# Patient Record
Sex: Male | Born: 1955 | Race: White | Hispanic: No | Marital: Married | Smoking: Former smoker
Health system: Southern US, Community
[De-identification: ages and names within clinical notes are randomized; demographics above are authoritative.]

## PROBLEM LIST (undated history)

## (undated) DIAGNOSIS — E785 Hyperlipidemia, unspecified: Secondary | ICD-10-CM

## (undated) DIAGNOSIS — F411 Generalized anxiety disorder: Secondary | ICD-10-CM

## (undated) DIAGNOSIS — M1990 Unspecified osteoarthritis, unspecified site: Secondary | ICD-10-CM

## (undated) HISTORY — DX: Generalized anxiety disorder: F41.1

## (undated) HISTORY — PX: INGUINAL HERNIA REPAIR: SUR1180

## (undated) HISTORY — DX: Hyperlipidemia, unspecified: E78.5

## (undated) HISTORY — PX: ROTATOR CUFF REPAIR: SHX139

## (undated) HISTORY — PX: OTHER SURGICAL HISTORY: SHX169

---

## 2004-12-13 ENCOUNTER — Ambulatory Visit (HOSPITAL_COMMUNITY): Admission: RE | Admit: 2004-12-13 | Discharge: 2004-12-13 | Payer: Self-pay | Admitting: *Deleted

## 2015-08-18 ENCOUNTER — Other Ambulatory Visit: Payer: Self-pay | Admitting: Internal Medicine

## 2015-08-18 DIAGNOSIS — K449 Diaphragmatic hernia without obstruction or gangrene: Secondary | ICD-10-CM

## 2015-08-22 ENCOUNTER — Ambulatory Visit
Admission: RE | Admit: 2015-08-22 | Discharge: 2015-08-22 | Disposition: A | Discharge status: Home / Self Care | Payer: BLUE CROSS/BLUE SHIELD | Source: Ambulatory Visit | Attending: Internal Medicine | Admitting: Internal Medicine

## 2015-08-22 DIAGNOSIS — K449 Diaphragmatic hernia without obstruction or gangrene: Secondary | ICD-10-CM

## 2016-01-11 DIAGNOSIS — Z79899 Other long term (current) drug therapy: Secondary | ICD-10-CM | POA: Diagnosis not present

## 2016-01-11 DIAGNOSIS — M0579 Rheumatoid arthritis with rheumatoid factor of multiple sites without organ or systems involvement: Secondary | ICD-10-CM | POA: Diagnosis not present

## 2016-01-11 DIAGNOSIS — M79641 Pain in right hand: Secondary | ICD-10-CM | POA: Diagnosis not present

## 2016-04-13 DIAGNOSIS — H16041 Marginal corneal ulcer, right eye: Secondary | ICD-10-CM | POA: Diagnosis not present

## 2016-04-17 DIAGNOSIS — H16041 Marginal corneal ulcer, right eye: Secondary | ICD-10-CM | POA: Diagnosis not present

## 2016-04-17 DIAGNOSIS — H01009 Unspecified blepharitis unspecified eye, unspecified eyelid: Secondary | ICD-10-CM | POA: Diagnosis not present

## 2016-04-23 DIAGNOSIS — H01009 Unspecified blepharitis unspecified eye, unspecified eyelid: Secondary | ICD-10-CM | POA: Diagnosis not present

## 2016-04-23 DIAGNOSIS — H16041 Marginal corneal ulcer, right eye: Secondary | ICD-10-CM | POA: Diagnosis not present

## 2016-07-10 DIAGNOSIS — N528 Other male erectile dysfunction: Secondary | ICD-10-CM | POA: Diagnosis not present

## 2016-07-10 DIAGNOSIS — N218 Other lower urinary tract calculus: Secondary | ICD-10-CM | POA: Diagnosis not present

## 2016-07-10 DIAGNOSIS — E349 Endocrine disorder, unspecified: Secondary | ICD-10-CM | POA: Diagnosis not present

## 2016-07-10 DIAGNOSIS — F5232 Male orgasmic disorder: Secondary | ICD-10-CM | POA: Diagnosis not present

## 2016-07-12 DIAGNOSIS — M0579 Rheumatoid arthritis with rheumatoid factor of multiple sites without organ or systems involvement: Secondary | ICD-10-CM | POA: Diagnosis not present

## 2016-07-12 DIAGNOSIS — Z79899 Other long term (current) drug therapy: Secondary | ICD-10-CM | POA: Diagnosis not present

## 2016-07-31 DIAGNOSIS — E538 Deficiency of other specified B group vitamins: Secondary | ICD-10-CM | POA: Diagnosis not present

## 2016-08-06 DIAGNOSIS — Z0000 Encounter for general adult medical examination without abnormal findings: Secondary | ICD-10-CM | POA: Diagnosis not present

## 2016-08-06 DIAGNOSIS — Z125 Encounter for screening for malignant neoplasm of prostate: Secondary | ICD-10-CM | POA: Diagnosis not present

## 2016-08-08 DIAGNOSIS — L723 Sebaceous cyst: Secondary | ICD-10-CM | POA: Diagnosis not present

## 2016-08-08 DIAGNOSIS — L570 Actinic keratosis: Secondary | ICD-10-CM | POA: Diagnosis not present

## 2016-08-08 DIAGNOSIS — X32XXXD Exposure to sunlight, subsequent encounter: Secondary | ICD-10-CM | POA: Diagnosis not present

## 2016-08-10 DIAGNOSIS — Z880 Allergy status to penicillin: Secondary | ICD-10-CM | POA: Diagnosis not present

## 2016-08-10 DIAGNOSIS — Z0001 Encounter for general adult medical examination with abnormal findings: Secondary | ICD-10-CM | POA: Diagnosis not present

## 2016-08-10 DIAGNOSIS — Z23 Encounter for immunization: Secondary | ICD-10-CM | POA: Diagnosis not present

## 2016-08-10 DIAGNOSIS — E291 Testicular hypofunction: Secondary | ICD-10-CM | POA: Diagnosis not present

## 2016-09-11 DIAGNOSIS — D3131 Benign neoplasm of right choroid: Secondary | ICD-10-CM | POA: Diagnosis not present

## 2016-10-23 DIAGNOSIS — E349 Endocrine disorder, unspecified: Secondary | ICD-10-CM | POA: Diagnosis not present

## 2016-10-23 DIAGNOSIS — N281 Cyst of kidney, acquired: Secondary | ICD-10-CM | POA: Diagnosis not present

## 2016-10-23 DIAGNOSIS — N218 Other lower urinary tract calculus: Secondary | ICD-10-CM | POA: Diagnosis not present

## 2016-10-23 DIAGNOSIS — N528 Other male erectile dysfunction: Secondary | ICD-10-CM | POA: Diagnosis not present

## 2016-10-23 DIAGNOSIS — E538 Deficiency of other specified B group vitamins: Secondary | ICD-10-CM | POA: Diagnosis not present

## 2016-12-14 DIAGNOSIS — R3129 Other microscopic hematuria: Secondary | ICD-10-CM | POA: Diagnosis not present

## 2016-12-17 DIAGNOSIS — M25511 Pain in right shoulder: Secondary | ICD-10-CM | POA: Diagnosis not present

## 2016-12-17 DIAGNOSIS — M25562 Pain in left knee: Secondary | ICD-10-CM | POA: Diagnosis not present

## 2016-12-21 DIAGNOSIS — R3129 Other microscopic hematuria: Secondary | ICD-10-CM | POA: Diagnosis not present

## 2016-12-21 DIAGNOSIS — Q548 Other hypospadias: Secondary | ICD-10-CM | POA: Diagnosis not present

## 2016-12-21 DIAGNOSIS — N281 Cyst of kidney, acquired: Secondary | ICD-10-CM | POA: Diagnosis not present

## 2016-12-21 DIAGNOSIS — R828 Abnormal findings on cytological and histological examination of urine: Secondary | ICD-10-CM | POA: Diagnosis not present

## 2016-12-21 DIAGNOSIS — N529 Male erectile dysfunction, unspecified: Secondary | ICD-10-CM | POA: Diagnosis not present

## 2017-01-10 DIAGNOSIS — Z79899 Other long term (current) drug therapy: Secondary | ICD-10-CM | POA: Diagnosis not present

## 2017-01-10 DIAGNOSIS — M79641 Pain in right hand: Secondary | ICD-10-CM | POA: Diagnosis not present

## 2017-01-10 DIAGNOSIS — M0579 Rheumatoid arthritis with rheumatoid factor of multiple sites without organ or systems involvement: Secondary | ICD-10-CM | POA: Diagnosis not present

## 2017-01-23 DIAGNOSIS — M25511 Pain in right shoulder: Secondary | ICD-10-CM | POA: Diagnosis not present

## 2017-01-25 DIAGNOSIS — S46011A Strain of muscle(s) and tendon(s) of the rotator cuff of right shoulder, initial encounter: Secondary | ICD-10-CM | POA: Diagnosis not present

## 2017-01-25 DIAGNOSIS — M25511 Pain in right shoulder: Secondary | ICD-10-CM | POA: Diagnosis not present

## 2017-02-14 DIAGNOSIS — M7541 Impingement syndrome of right shoulder: Secondary | ICD-10-CM | POA: Diagnosis not present

## 2017-02-14 DIAGNOSIS — G8918 Other acute postprocedural pain: Secondary | ICD-10-CM | POA: Diagnosis not present

## 2017-02-14 DIAGNOSIS — M75121 Complete rotator cuff tear or rupture of right shoulder, not specified as traumatic: Secondary | ICD-10-CM | POA: Diagnosis not present

## 2017-04-12 DIAGNOSIS — M25511 Pain in right shoulder: Secondary | ICD-10-CM | POA: Diagnosis not present

## 2017-04-18 DIAGNOSIS — M25511 Pain in right shoulder: Secondary | ICD-10-CM | POA: Diagnosis not present

## 2017-04-25 DIAGNOSIS — M25511 Pain in right shoulder: Secondary | ICD-10-CM | POA: Diagnosis not present

## 2017-05-02 DIAGNOSIS — M25511 Pain in right shoulder: Secondary | ICD-10-CM | POA: Diagnosis not present

## 2017-05-09 DIAGNOSIS — M25511 Pain in right shoulder: Secondary | ICD-10-CM | POA: Diagnosis not present

## 2017-05-22 DIAGNOSIS — L570 Actinic keratosis: Secondary | ICD-10-CM | POA: Diagnosis not present

## 2017-05-22 DIAGNOSIS — X32XXXD Exposure to sunlight, subsequent encounter: Secondary | ICD-10-CM | POA: Diagnosis not present

## 2017-05-30 DIAGNOSIS — B029 Zoster without complications: Secondary | ICD-10-CM | POA: Diagnosis not present

## 2017-06-03 DIAGNOSIS — Z4789 Encounter for other orthopedic aftercare: Secondary | ICD-10-CM | POA: Diagnosis not present

## 2017-06-03 DIAGNOSIS — S46011D Strain of muscle(s) and tendon(s) of the rotator cuff of right shoulder, subsequent encounter: Secondary | ICD-10-CM | POA: Diagnosis not present

## 2017-07-01 DIAGNOSIS — M0579 Rheumatoid arthritis with rheumatoid factor of multiple sites without organ or systems involvement: Secondary | ICD-10-CM | POA: Diagnosis not present

## 2017-07-18 DIAGNOSIS — L729 Follicular cyst of the skin and subcutaneous tissue, unspecified: Secondary | ICD-10-CM | POA: Diagnosis not present

## 2017-07-19 DIAGNOSIS — H1850 Unspecified hereditary corneal dystrophies: Secondary | ICD-10-CM | POA: Diagnosis not present

## 2017-07-19 DIAGNOSIS — H16141 Punctate keratitis, right eye: Secondary | ICD-10-CM | POA: Diagnosis not present

## 2017-08-08 DIAGNOSIS — Z0000 Encounter for general adult medical examination without abnormal findings: Secondary | ICD-10-CM | POA: Diagnosis not present

## 2017-08-08 DIAGNOSIS — E7800 Pure hypercholesterolemia, unspecified: Secondary | ICD-10-CM | POA: Diagnosis not present

## 2017-08-08 DIAGNOSIS — Z125 Encounter for screening for malignant neoplasm of prostate: Secondary | ICD-10-CM | POA: Diagnosis not present

## 2017-08-13 DIAGNOSIS — Z0001 Encounter for general adult medical examination with abnormal findings: Secondary | ICD-10-CM | POA: Diagnosis not present

## 2017-08-13 DIAGNOSIS — E7800 Pure hypercholesterolemia, unspecified: Secondary | ICD-10-CM | POA: Diagnosis not present

## 2017-08-13 DIAGNOSIS — Z1159 Encounter for screening for other viral diseases: Secondary | ICD-10-CM | POA: Diagnosis not present

## 2017-08-13 DIAGNOSIS — Z6827 Body mass index (BMI) 27.0-27.9, adult: Secondary | ICD-10-CM | POA: Diagnosis not present

## 2017-10-16 DIAGNOSIS — H04123 Dry eye syndrome of bilateral lacrimal glands: Secondary | ICD-10-CM | POA: Diagnosis not present

## 2017-10-16 DIAGNOSIS — H11002 Unspecified pterygium of left eye: Secondary | ICD-10-CM | POA: Diagnosis not present

## 2017-10-16 DIAGNOSIS — D3131 Benign neoplasm of right choroid: Secondary | ICD-10-CM | POA: Diagnosis not present

## 2017-12-24 DIAGNOSIS — N529 Male erectile dysfunction, unspecified: Secondary | ICD-10-CM | POA: Diagnosis not present

## 2017-12-24 DIAGNOSIS — Q540 Hypospadias, balanic: Secondary | ICD-10-CM | POA: Diagnosis not present

## 2017-12-24 DIAGNOSIS — N281 Cyst of kidney, acquired: Secondary | ICD-10-CM | POA: Diagnosis not present

## 2017-12-24 DIAGNOSIS — Z87448 Personal history of other diseases of urinary system: Secondary | ICD-10-CM | POA: Diagnosis not present

## 2017-12-30 DIAGNOSIS — Z79899 Other long term (current) drug therapy: Secondary | ICD-10-CM | POA: Diagnosis not present

## 2017-12-30 DIAGNOSIS — M0579 Rheumatoid arthritis with rheumatoid factor of multiple sites without organ or systems involvement: Secondary | ICD-10-CM | POA: Diagnosis not present

## 2017-12-30 DIAGNOSIS — M2550 Pain in unspecified joint: Secondary | ICD-10-CM | POA: Diagnosis not present

## 2018-04-08 DIAGNOSIS — Z23 Encounter for immunization: Secondary | ICD-10-CM | POA: Diagnosis not present

## 2018-08-11 DIAGNOSIS — X32XXXD Exposure to sunlight, subsequent encounter: Secondary | ICD-10-CM | POA: Diagnosis not present

## 2018-08-11 DIAGNOSIS — L570 Actinic keratosis: Secondary | ICD-10-CM | POA: Diagnosis not present

## 2018-08-13 DIAGNOSIS — M2550 Pain in unspecified joint: Secondary | ICD-10-CM | POA: Diagnosis not present

## 2018-08-13 DIAGNOSIS — Z79899 Other long term (current) drug therapy: Secondary | ICD-10-CM | POA: Diagnosis not present

## 2018-08-13 DIAGNOSIS — M0579 Rheumatoid arthritis with rheumatoid factor of multiple sites without organ or systems involvement: Secondary | ICD-10-CM | POA: Diagnosis not present

## 2018-08-14 DIAGNOSIS — K219 Gastro-esophageal reflux disease without esophagitis: Secondary | ICD-10-CM | POA: Diagnosis not present

## 2018-08-14 DIAGNOSIS — Z0000 Encounter for general adult medical examination without abnormal findings: Secondary | ICD-10-CM | POA: Diagnosis not present

## 2018-08-18 DIAGNOSIS — E7800 Pure hypercholesterolemia, unspecified: Secondary | ICD-10-CM | POA: Diagnosis not present

## 2018-08-18 DIAGNOSIS — Z0000 Encounter for general adult medical examination without abnormal findings: Secondary | ICD-10-CM | POA: Diagnosis not present

## 2018-08-28 DIAGNOSIS — L729 Follicular cyst of the skin and subcutaneous tissue, unspecified: Secondary | ICD-10-CM | POA: Diagnosis not present

## 2018-09-30 ENCOUNTER — Other Ambulatory Visit: Payer: Self-pay | Admitting: General Surgery

## 2018-09-30 DIAGNOSIS — L723 Sebaceous cyst: Secondary | ICD-10-CM | POA: Diagnosis not present

## 2018-09-30 DIAGNOSIS — L720 Epidermal cyst: Secondary | ICD-10-CM | POA: Diagnosis not present

## 2019-02-11 DIAGNOSIS — Z79899 Other long term (current) drug therapy: Secondary | ICD-10-CM | POA: Diagnosis not present

## 2019-02-11 DIAGNOSIS — M2550 Pain in unspecified joint: Secondary | ICD-10-CM | POA: Diagnosis not present

## 2019-02-11 DIAGNOSIS — M0579 Rheumatoid arthritis with rheumatoid factor of multiple sites without organ or systems involvement: Secondary | ICD-10-CM | POA: Diagnosis not present

## 2019-02-16 DIAGNOSIS — L729 Follicular cyst of the skin and subcutaneous tissue, unspecified: Secondary | ICD-10-CM | POA: Diagnosis not present

## 2019-03-12 ENCOUNTER — Other Ambulatory Visit: Payer: Self-pay | Admitting: General Surgery

## 2019-03-12 DIAGNOSIS — L720 Epidermal cyst: Secondary | ICD-10-CM | POA: Diagnosis not present

## 2019-04-21 ENCOUNTER — Other Ambulatory Visit: Payer: Self-pay

## 2019-04-21 ENCOUNTER — Emergency Department (HOSPITAL_COMMUNITY): Payer: BC Managed Care – PPO

## 2019-04-21 ENCOUNTER — Encounter (HOSPITAL_COMMUNITY): Payer: Self-pay

## 2019-04-21 ENCOUNTER — Emergency Department (HOSPITAL_COMMUNITY)
Admission: EM | Admit: 2019-04-21 | Discharge: 2019-04-21 | Disposition: A | Discharge status: Home / Self Care | Payer: BC Managed Care – PPO | Attending: Emergency Medicine | Admitting: Emergency Medicine

## 2019-04-21 DIAGNOSIS — Z79899 Other long term (current) drug therapy: Secondary | ICD-10-CM | POA: Diagnosis not present

## 2019-04-21 DIAGNOSIS — R109 Unspecified abdominal pain: Secondary | ICD-10-CM | POA: Diagnosis not present

## 2019-04-21 DIAGNOSIS — K5792 Diverticulitis of intestine, part unspecified, without perforation or abscess without bleeding: Secondary | ICD-10-CM

## 2019-04-21 DIAGNOSIS — R1031 Right lower quadrant pain: Secondary | ICD-10-CM | POA: Diagnosis not present

## 2019-04-21 HISTORY — DX: Unspecified osteoarthritis, unspecified site: M19.90

## 2019-04-21 LAB — CBC WITH DIFFERENTIAL/PLATELET
Abs Immature Granulocytes: 0.03 10*3/uL (ref 0.00–0.07)
Basophils Absolute: 0 10*3/uL (ref 0.0–0.1)
Basophils Relative: 0 %
Eosinophils Absolute: 0.3 10*3/uL (ref 0.0–0.5)
Eosinophils Relative: 2 %
HCT: 49.4 % (ref 39.0–52.0)
Hemoglobin: 16.5 g/dL (ref 13.0–17.0)
Immature Granulocytes: 0 %
Lymphocytes Relative: 14 %
Lymphs Abs: 1.6 10*3/uL (ref 0.7–4.0)
MCH: 30.8 pg (ref 26.0–34.0)
MCHC: 33.4 g/dL (ref 30.0–36.0)
MCV: 92.3 fL (ref 80.0–100.0)
Monocytes Absolute: 1.1 10*3/uL — ABNORMAL HIGH (ref 0.1–1.0)
Monocytes Relative: 10 %
Neutro Abs: 8.1 10*3/uL — ABNORMAL HIGH (ref 1.7–7.7)
Neutrophils Relative %: 74 %
Platelets: 230 10*3/uL (ref 150–400)
RBC: 5.35 MIL/uL (ref 4.22–5.81)
RDW: 12.3 % (ref 11.5–15.5)
WBC: 11.1 10*3/uL — ABNORMAL HIGH (ref 4.0–10.5)
nRBC: 0 % (ref 0.0–0.2)

## 2019-04-21 LAB — COMPREHENSIVE METABOLIC PANEL
ALT: 26 U/L (ref 0–44)
AST: 25 U/L (ref 15–41)
Albumin: 3.8 g/dL (ref 3.5–5.0)
Alkaline Phosphatase: 65 U/L (ref 38–126)
Anion gap: 10 (ref 5–15)
BUN: 17 mg/dL (ref 8–23)
CO2: 24 mmol/L (ref 22–32)
Calcium: 9.2 mg/dL (ref 8.9–10.3)
Chloride: 105 mmol/L (ref 98–111)
Creatinine, Ser: 1.09 mg/dL (ref 0.61–1.24)
GFR calc Af Amer: >60 mL/min (ref >60)
GFR calc non Af Amer: >60 mL/min (ref >60)
Glucose, Bld: 101 mg/dL — ABNORMAL HIGH (ref 70–99)
Potassium: 4 mmol/L (ref 3.5–5.1)
Sodium: 139 mmol/L (ref 135–145)
Total Bilirubin: 1.6 mg/dL — ABNORMAL HIGH (ref 0.3–1.2)
Total Protein: 7.1 g/dL (ref 6.5–8.1)

## 2019-04-21 LAB — URINALYSIS, ROUTINE W REFLEX MICROSCOPIC
Bacteria, UA: NONE SEEN
Bilirubin Urine: NEGATIVE
Glucose, UA: NEGATIVE mg/dL
Ketones, ur: NEGATIVE mg/dL
Leukocytes,Ua: NEGATIVE
Nitrite: NEGATIVE
Protein, ur: 30 mg/dL — AB
Specific Gravity, Urine: 1.018 (ref 1.005–1.030)
pH: 6 (ref 5.0–8.0)

## 2019-04-21 MED ORDER — METRONIDAZOLE 500 MG PO TABS
500.0000 mg | ORAL_TABLET | Freq: Once | ORAL | Status: AC
  Administered 2019-04-21: 20:00:00 500 mg via ORAL
  Filled 2019-04-21: qty 1

## 2019-04-21 MED ORDER — CIPROFLOXACIN HCL 500 MG PO TABS
500.0000 mg | ORAL_TABLET | Freq: Once | ORAL | Status: AC
  Administered 2019-04-21: 20:00:00 500 mg via ORAL
  Filled 2019-04-21: qty 1

## 2019-04-21 MED ORDER — IOHEXOL 300 MG/ML  SOLN
100.0000 mL | Freq: Once | INTRAMUSCULAR | Status: AC | PRN
  Administered 2019-04-21: 19:00:00 100 mL via INTRAVENOUS

## 2019-04-21 MED ORDER — SODIUM CHLORIDE (PF) 0.9 % IJ SOLN
INTRAMUSCULAR | Status: AC
  Administered 2019-04-21: 19:00:00
  Filled 2019-04-21: qty 50

## 2019-04-21 MED ORDER — CIPROFLOXACIN HCL 500 MG PO TABS: 500.0000 mg | ORAL_TABLET | Freq: Two times a day (BID) | ORAL | 0 refills | Status: DC

## 2019-04-21 MED ORDER — METRONIDAZOLE 500 MG PO TABS: 500.0000 mg | ORAL_TABLET | Freq: Three times a day (TID) | ORAL | 0 refills | Status: AC

## 2019-04-21 NOTE — ED Triage Notes (Signed)
Pt reports lower abd pain all day. Pt sent from PCP to r/o appendicitis.

## 2019-04-21 NOTE — ED Provider Notes (Signed)
[facility] DEPT Provider Note   CSN: [id number] Arrival date & time: 04/21/19  1458     History   Chief Complaint Chief Complaint  Patient presents with  . Abdominal Pain    HPI Tyler Arnold is a 63 y.o. male with past medical history of remote inguinal hernia repair at age 58, presenting to the emergency department with bilateral lower abdominal pain.  Patient states about a week ago he had symptoms in his lower abdomen of pain described as a toothache.  He also felt some constipation though that was improved with a laxative.  He has had normal bowel movements since then all the way through today.  He states his pain increased today and is still in his lower abdomen, was earlier on his right lower quadrant by PCP, it is now currently in his left lower quadrant.  It is worse when he sits up.  It feels mostly like a toothache though with some intermittent sharp pains.  He has no associated nausea, vomiting, diarrhea.  He feels as though he needs to pass gas.  He denies urinary symptoms, fever.  Seen at PCP today and sent here for further evaluation.  No known history of diverticulitis.     The history is provided by the patient.    Past Medical History:  Diagnosis Date  . Arthritis    rheumatoid    There are no active problems to display for this patient.   Past Surgical History:  Procedure Laterality Date  . ROTATOR CUFF REPAIR Right         Home Medications    Prior to Admission medications   Medication Sig Start Date End Date Taking? Authorizing Provider  atorvastatin (LIPITOR) 20 MG tablet Take 20 mg by mouth daily.   Yes [provider]  busPIRone (BUSPAR) 7.5 MG tablet Take 15 mg by mouth 2 (two) times daily.   Yes [provider]  cyanocobalamin (,VITAMIN B-12,) 1000 MCG/ML injection Inject 1,000 mcg into the muscle every 30 (thirty) days.   Yes [provider]  etanercept (ENBREL) 50 MG/ML injection  Inject 50 mg into the skin once a week.   Yes [provider]  famotidine (PEPCID) 20 MG tablet Take 20 mg by mouth at bedtime as needed for heartburn or indigestion.   Yes [provider]  fluticasone (FLONASE) 50 MCG/ACT nasal spray Place 2 sprays into both nostrils daily.   Yes [provider]  multivitamin (ONE-A-DAY MEN'S) TABS tablet Take 1 tablet by mouth daily.   Yes [provider]  naproxen (NAPROSYN) 250 MG tablet Take 250 mg by mouth 2 (two) times daily with a meal.   Yes [provider]  Omega-3 Fatty Acids (FISH OIL) 1000 MG CAPS Take 1,000 mg by mouth 2 (two) times daily.   Yes [provider]  sildenafil (REVATIO) 20 MG tablet Take 40-100 mg by mouth daily as needed (Erectile Dysfunction).   Yes [provider]  Testosterone 25 MG/2.5GM (1%) GEL Place 1 Pump onto the skin See admin instructions. Apply 1 pump under each arm daily.   Yes [provider]  ciprofloxacin (CIPRO) 500 MG tablet Take 1 tablet (500 mg total) by mouth 2 (two) times daily. 04/21/19   Harold Moncus, [place] N, PA-C  metroNIDAZOLE (FLAGYL) 500 MG tablet Take 1 tablet (500 mg total) by mouth 3 (three) times daily for 7 days. 04/21/19 04/28/19  Welles Walthall, [place] N, PA-C    Family History No family  history on file.  Social History Social History   Tobacco Use  . Smoking status: Never Smoker  . Smokeless tobacco: Never Used  Substance Use Topics  . Alcohol use: Yes    Comment: socially  . Drug use: Never     Allergies   Penicillins   Review of Systems Review of Systems  Constitutional: Negative for fever.  Gastrointestinal: Positive for abdominal pain.  All other systems reviewed and are negative.    Physical Exam Updated Vital Signs BP (!) 123/91   Pulse 78   Temp 98.8 F (37.1 C) (Oral)   Resp 16   Wt 84.4 kg   SpO2 100%   Physical Exam Vitals signs and nursing note reviewed.  Constitutional:      General: He is not in  acute distress.    Appearance: He is well-developed. He is not ill-appearing.  HENT:     Head: Normocephalic and atraumatic.  Eyes:     Conjunctiva/sclera: Conjunctivae normal.  Cardiovascular:     Rate and Rhythm: Normal rate and regular rhythm.  Pulmonary:     Effort: Pulmonary effort is normal. No respiratory distress.     Breath sounds: Normal breath sounds.  Abdominal:     General: Bowel sounds are normal.     Palpations: Abdomen is soft.     Tenderness: There is abdominal tenderness in the left lower quadrant. There is no guarding or rebound. Negative signs include Rovsing's sign and McBurney's sign.  Skin:    General: Skin is warm.  Neurological:     Mental Status: He is alert.  Psychiatric:        Behavior: Behavior normal.      ED Treatments / Results  Labs (all labs ordered are listed, but only abnormal results are displayed) Labs Reviewed  COMPREHENSIVE METABOLIC PANEL - Abnormal; Notable for the following components:      Result Value   Glucose, Bld 101 (*)    Total Bilirubin 1.6 (*)    All other components within normal limits  CBC WITH DIFFERENTIAL/PLATELET - Abnormal; Notable for the following components:   WBC 11.1 (*)    Neutro Abs 8.1 (*)    Monocytes Absolute 1.1 (*)    All other components within normal limits  URINALYSIS, ROUTINE W REFLEX MICROSCOPIC - Abnormal; Notable for the following components:   Hgb urine dipstick MODERATE (*)    Protein, ur 30 (*)    All other components within normal limits    EKG None  Radiology Ct Abdomen Pelvis W Contrast  Result Date: 04/21/2019 CLINICAL DATA:  Acute abdominal pain. Progressive right lower quadrant pain. EXAM: CT ABDOMEN AND PELVIS WITH CONTRAST TECHNIQUE: Multidetector CT imaging of the abdomen and pelvis was performed using the standard protocol following bolus administration of intravenous contrast. CONTRAST:  130mL OMNIPAQUE IOHEXOL 300 MG/ML  SOLN COMPARISON:  Report from CT 09/20/2014 at [facility], images not available. FINDINGS: Lower chest: Lobulated serpiginous hyperdensity in the left costophrenic angle is communication with the vasculature and is consistent with vascular malformation. Mild adjacent atelectasis and minimal pleural thickening. Minimal subsegmental atelectasis in the right lower lobe. Heart is normal in size. Hepatobiliary: No focal liver abnormality is seen. No gallstones, gallbladder wall thickening, or biliary dilatation. Pancreas: No ductal dilatation or inflammation. Spleen: Normal in size without focal abnormality. Adrenals/Urinary Tract: No adrenal nodule. No hydronephrosis or perinephric edema. Homogeneous renal enhancement with symmetric excretion on delayed phase imaging. Subcentimeter cortical hypodensities in the lower left kidney  are too small to characterize but likely cysts. Urinary bladder is physiologically distended without wall thickening. Stomach/Bowel: Tiny hiatal hernia. Stomach physiologically distended. No small bowel obstruction or inflammatory change. Cecum slightly high-riding in the right mid abdomen. The appendix is not confidently visualized, there is no pericecal inflammation to suggest appendicitis. Two adjacent inflamed diverticula with associated colonic wall thickening and pericolonic edema. Small amount of free fluid in the left pericolic gutter. No extraluminal air or abscess. Additional noninflamed colonic diverticula throughout the colon, prominent in the sigmoid. At the junction of the descending and sigmoid colon Vascular/Lymphatic: Normal caliber abdominal aorta. Retroaortic left renal vein, normal variant. The portal vein is patent. No enlarged lymph nodes in the abdomen or pelvis. Reproductive: No acute findings. Slightly prominent prostate gland 4.8 cm transverse. Other: Small amount of free fluid in the left pericolic gutter. No free air. No intra-abdominal abscess. Small fat containing umbilical hernia. Musculoskeletal:  Unilateral left L5 pars interarticularis defect. No listhesis. There are no acute or suspicious osseous abnormalities. IMPRESSION: 1. Uncomplicated acute diverticulitis at the junction of the descending and sigmoid colon. No perforation or abscess. 2. 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 3. Incidental small pulmonary vascular formation in the included lung bases in the left lower lobe. Electronically Signed   By: Keith Rake M.D.   On: 04/21/2019 19:13    Procedures Procedures (including critical care time)  Medications Ordered in ED Medications  ciprofloxacin (CIPRO) tablet 500 mg (has no administration in time range)  metroNIDAZOLE (FLAGYL) tablet 500 mg (has no administration in time range)  iohexol (OMNIPAQUE) 300 MG/ML solution 100 mL (100 mLs Intravenous Contrast Given 04/21/19 1834)  sodium chloride (PF) 0.9 % injection (  Given by Other 04/21/19 1905)     Initial Impression / Assessment and Plan / ED Course  I have reviewed the triage vital signs and the nursing notes.  Pertinent labs & imaging results that were available during my care of the patient were reviewed by me and considered in my medical decision making (see chart for details).        Patient presenting with intermittent lower abdominal pain, diagnosis of acute uncomplicated diverticulitis via CT scan today.  He is overall well-appearing, in no distress.  Requiring no pain medication in the ED.  Afebrile.  Mild leukocytosis of 11.1 on CBC though labs are otherwise unremarkable.  Patient will be treated with p.o. Cipro and Flagyl and instructed to follow close with PCP.  Strict return precautions discussed.  Patient agreeable to plan and safe for discharge.  Discussed results, findings, treatment and follow up. Patient advised of return precautions. Patient verbalized understanding and agreed with plan.  Final Clinical Impressions(s) / ED Diagnoses   Final diagnoses:  Diverticulitis     ED Discharge Orders         Ordered    ciprofloxacin (CIPRO) 500 MG tablet  2 times daily     04/21/19 1937    metroNIDAZOLE (FLAGYL) 500 MG tablet  3 times daily     04/21/19 1937           Domique Reardon, [place] N, PA-C 04/21/19 1939    Little, Wenda Overland, MD 04/22/19 [phone]

## 2019-04-21 NOTE — ED Notes (Signed)
Coming from PCP-states RLQ pain for about a week-states increased pain today-will be sending office note and lab results with patient

## 2019-04-21 NOTE — Discharge Instructions (Signed)
It is important you stay hydrated. Take the antibiotics as prescribed until completely gone. Do not drink alcohol with this antibiotic as it can cause vomiting. You can continue treating your symptoms with over-the-counter medications as needed. Follow-up with your primary care provider. Return to the emergency department for fevers, severely worsening pain, or new concerning symptoms.

## 2019-04-21 NOTE — ED Notes (Signed)
Pt was verbalized discharge instructions. Pt had no further questions at this time. NAD. 

## 2019-04-29 DIAGNOSIS — Z23 Encounter for immunization: Secondary | ICD-10-CM | POA: Diagnosis not present

## 2019-06-15 DIAGNOSIS — L570 Actinic keratosis: Secondary | ICD-10-CM | POA: Diagnosis not present

## 2019-06-15 DIAGNOSIS — X32XXXD Exposure to sunlight, subsequent encounter: Secondary | ICD-10-CM | POA: Diagnosis not present

## 2019-06-15 DIAGNOSIS — D225 Melanocytic nevi of trunk: Secondary | ICD-10-CM | POA: Diagnosis not present

## 2019-06-15 DIAGNOSIS — L821 Other seborrheic keratosis: Secondary | ICD-10-CM | POA: Diagnosis not present

## 2019-08-18 DIAGNOSIS — Z79899 Other long term (current) drug therapy: Secondary | ICD-10-CM | POA: Diagnosis not present

## 2019-08-18 DIAGNOSIS — Z111 Encounter for screening for respiratory tuberculosis: Secondary | ICD-10-CM | POA: Diagnosis not present

## 2019-08-18 DIAGNOSIS — M0579 Rheumatoid arthritis with rheumatoid factor of multiple sites without organ or systems involvement: Secondary | ICD-10-CM | POA: Diagnosis not present

## 2019-08-18 DIAGNOSIS — M2550 Pain in unspecified joint: Secondary | ICD-10-CM | POA: Diagnosis not present

## 2019-08-24 DIAGNOSIS — Z125 Encounter for screening for malignant neoplasm of prostate: Secondary | ICD-10-CM | POA: Diagnosis not present

## 2019-08-24 DIAGNOSIS — E7800 Pure hypercholesterolemia, unspecified: Secondary | ICD-10-CM | POA: Diagnosis not present

## 2019-08-26 DIAGNOSIS — Z87891 Personal history of nicotine dependence: Secondary | ICD-10-CM | POA: Diagnosis not present

## 2019-08-26 DIAGNOSIS — Z0001 Encounter for general adult medical examination with abnormal findings: Secondary | ICD-10-CM | POA: Diagnosis not present

## 2019-08-26 DIAGNOSIS — E7800 Pure hypercholesterolemia, unspecified: Secondary | ICD-10-CM | POA: Diagnosis not present

## 2019-09-25 IMAGING — CT CT ABD-PELV W/ CM
2 of 5 series · 15 of 46 positions shown, 17 images · IV contrast (omnipaque)
Comparison: Report from CT 09/20/2014 at [REDACTED],
images not available.

CLINICAL DATA: Acute abdominal pain. Progressive right lower
quadrant pain.

EXAM:
CT ABDOMEN AND PELVIS WITH CONTRAST
TECHNIQUE: Multidetector CT imaging of the abdomen and pelvis was performed
using the standard protocol following bolus administration of
intravenous contrast.
CONTRAST:  100mL OMNIPAQUE IOHEXOL 300 MG/ML  SOLN

[Series 2: axial st · axial · 0.83mm/px · z∈[+689,+1114]mm · 12 of 99 slices shown, 14 images]
[im 7/99  soft-tissue]
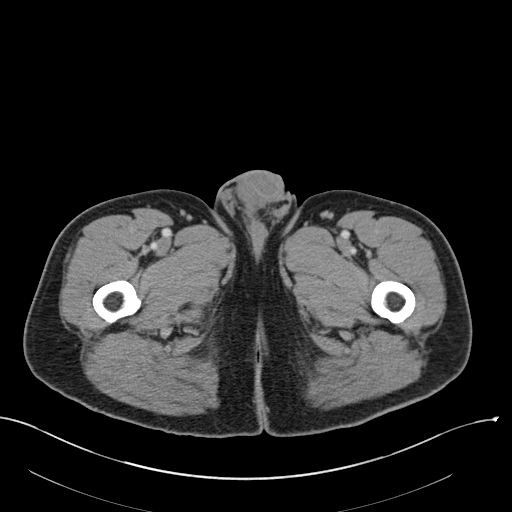
[im 7/99  bone]
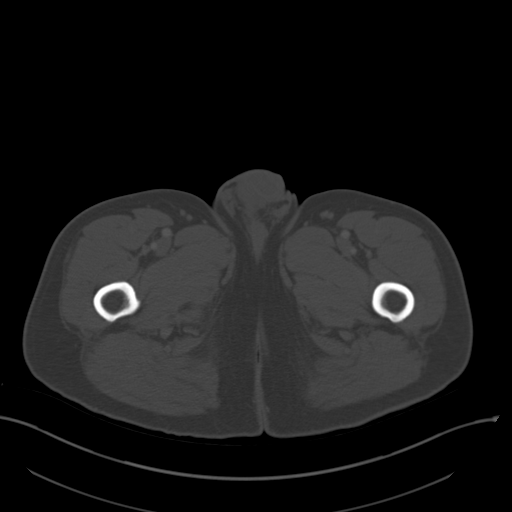
[im 14/99  soft-tissue]
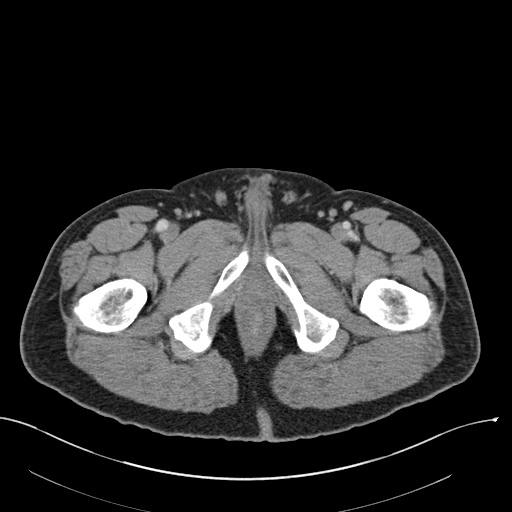
[im 20/99  soft-tissue]
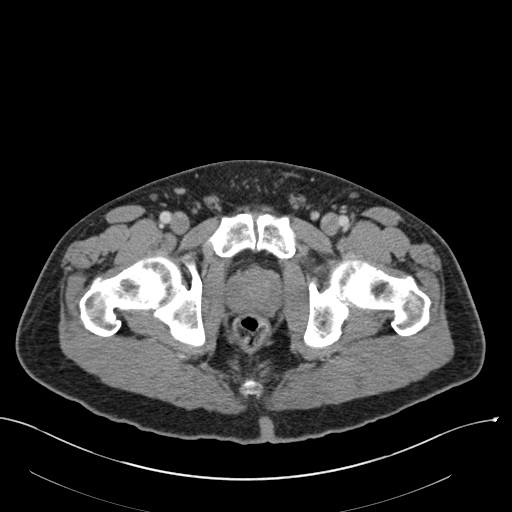
[im 33/99  soft-tissue]
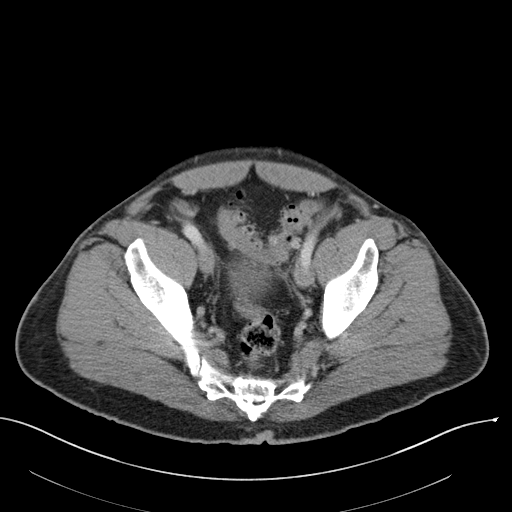
[im 40/99  soft-tissue]
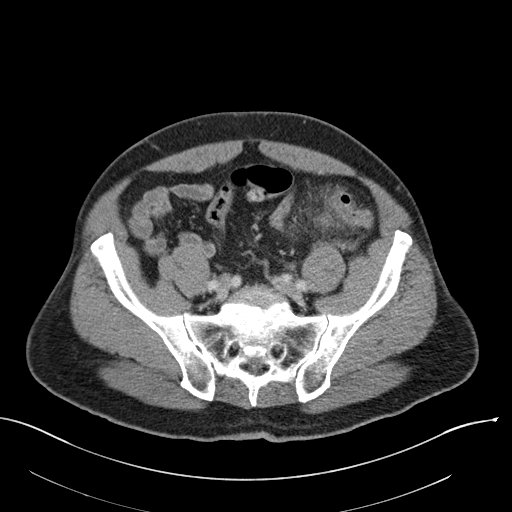
[im 46/99  soft-tissue]
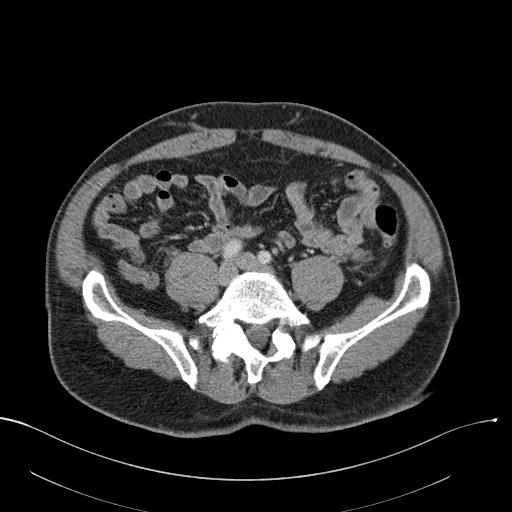
[im 53/99  soft-tissue]
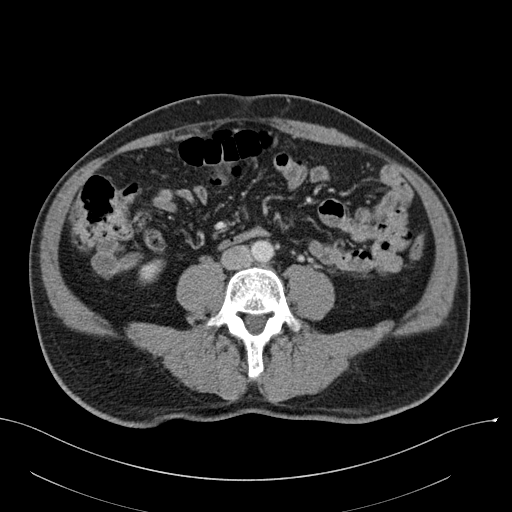
[im 59/99  soft-tissue]
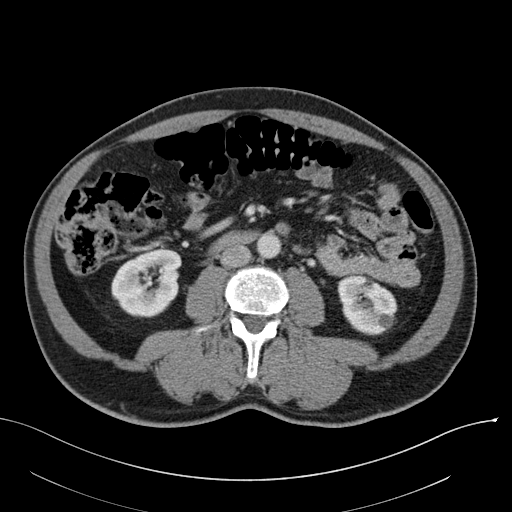
[im 66/99  soft-tissue]
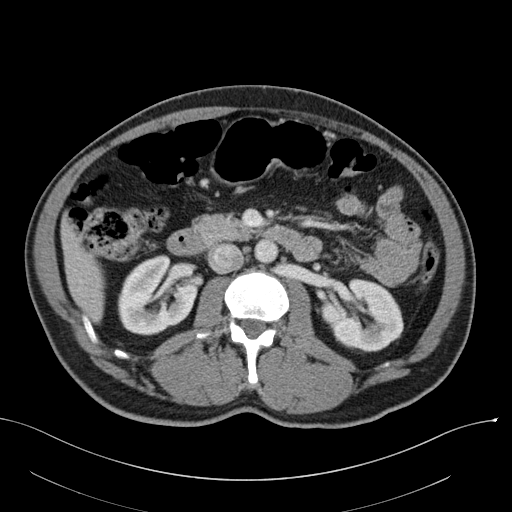
[im 66/99  bone]
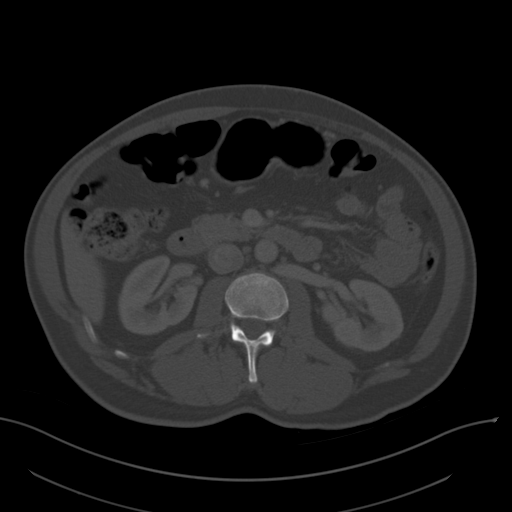
[im 79/99  soft-tissue]
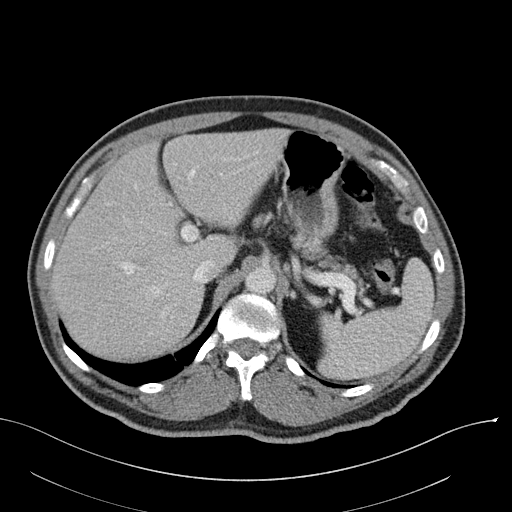
[im 85/99  soft-tissue]
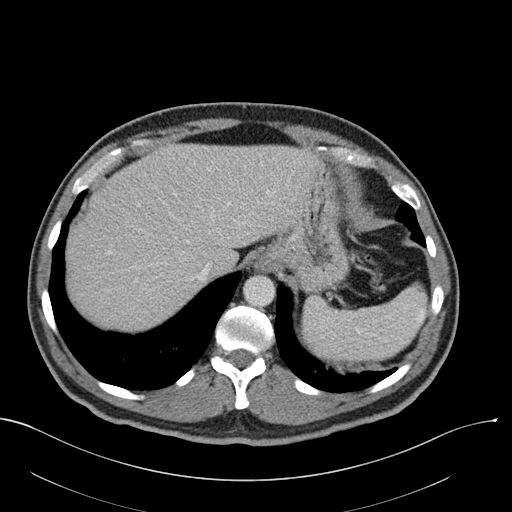
[im 92/99  soft-tissue]
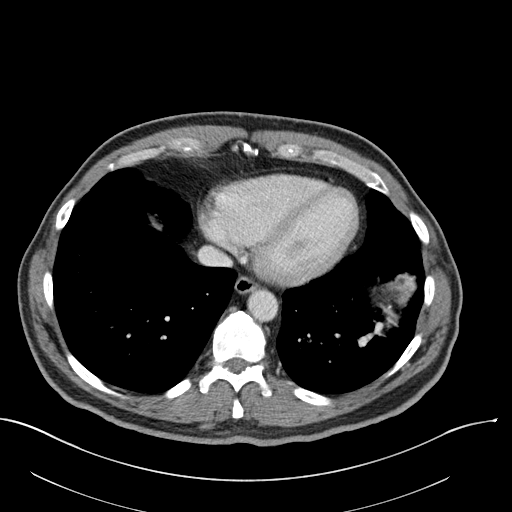

[Series 4: coronal st · coronal · 0.88mm/px · 3 of 138 slices shown]
[im 46/138  soft-tissue]
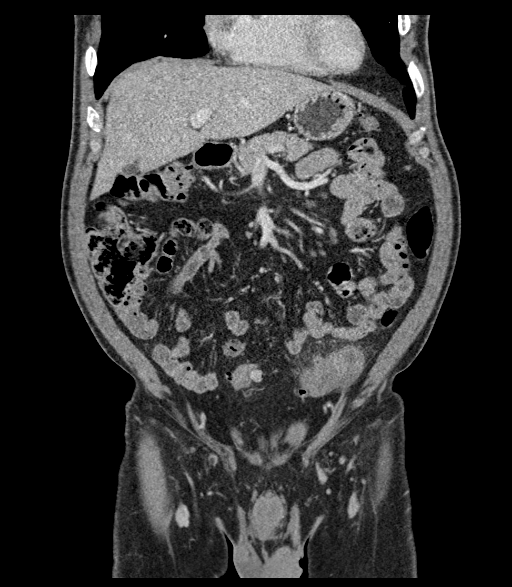
[im 61/138  soft-tissue]
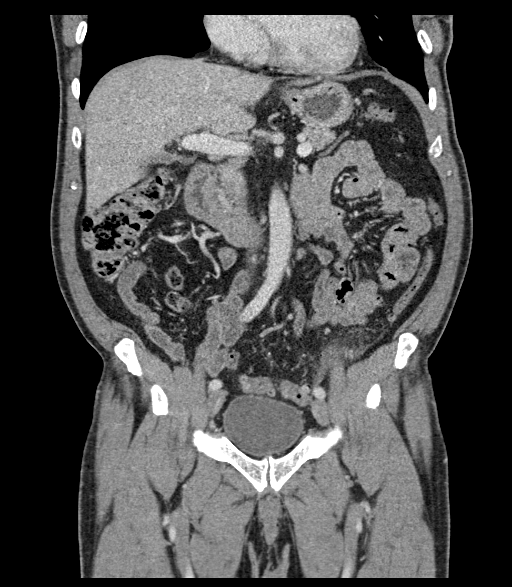
[im 77/138  soft-tissue]
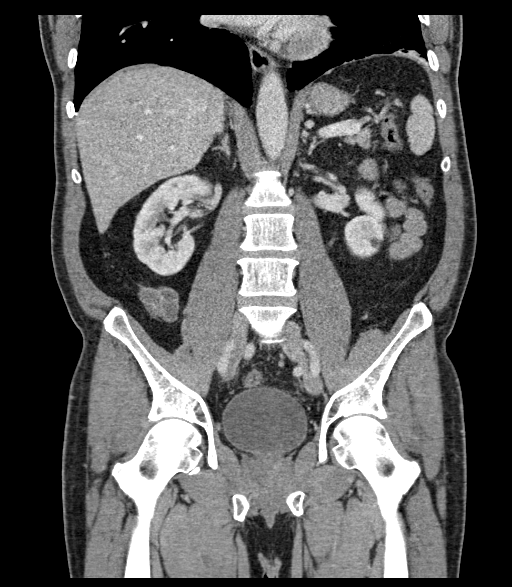

[15 of 46 positions shown; findings below may reference images not displayed]

FINDINGS: Lower chest: Lobulated serpiginous hyperdensity in the left
costophrenic angle is communication with the vasculature and is
consistent with vascular malformation. Mild adjacent atelectasis and
minimal pleural thickening. Minimal subsegmental atelectasis in the
right lower lobe. Heart is normal in size.

Hepatobiliary: No focal liver abnormality is seen. No gallstones,
gallbladder wall thickening, or biliary dilatation.

Pancreas: No ductal dilatation or inflammation.

Spleen: Normal in size without focal abnormality.

Adrenals/Urinary Tract: No adrenal nodule. No hydronephrosis or
perinephric edema. Homogeneous renal enhancement with symmetric
excretion on delayed phase imaging. Subcentimeter cortical
hypodensities in the lower left kidney are too small to characterize
but likely cysts. Urinary bladder is physiologically distended
without wall thickening.

Stomach/Bowel: Tiny hiatal hernia. Stomach physiologically
distended. No small bowel obstruction or inflammatory change. Cecum
slightly high-riding in the right mid abdomen. The appendix is not
confidently visualized, there is no pericecal inflammation to
suggest appendicitis.

Two adjacent inflamed diverticula with associated colonic wall
thickening and pericolonic edema. Small amount of free fluid in the
left pericolic gutter. No extraluminal air or abscess. Additional
noninflamed colonic diverticula throughout the colon, prominent in
the sigmoid. At the junction of the descending and sigmoid colon

Vascular/Lymphatic: Normal caliber abdominal aorta. Retroaortic left
renal vein, normal variant. The portal vein is patent. No enlarged
lymph nodes in the abdomen or pelvis.

Reproductive: No acute findings. Slightly prominent prostate gland
4.8 cm transverse.

Other: Small amount of free fluid in the left pericolic gutter. No
free air. No intra-abdominal abscess. Small fat containing umbilical
hernia.

Musculoskeletal: Unilateral left L5 pars interarticularis defect. No
listhesis. There are no acute or suspicious osseous abnormalities.
IMPRESSION: 1. Uncomplicated acute diverticulitis at the junction of the
descending and sigmoid colon. No perforation or abscess.
2. Unilateral left L5 pars interarticularis defect without
listhesis.
3. Incidental small pulmonary vascular formation in the included
lung bases in the left lower lobe.

## 2019-11-26 DIAGNOSIS — D3131 Benign neoplasm of right choroid: Secondary | ICD-10-CM | POA: Diagnosis not present

## 2020-02-22 DIAGNOSIS — M0579 Rheumatoid arthritis with rheumatoid factor of multiple sites without organ or systems involvement: Secondary | ICD-10-CM | POA: Diagnosis not present

## 2020-02-22 DIAGNOSIS — Z79899 Other long term (current) drug therapy: Secondary | ICD-10-CM | POA: Diagnosis not present

## 2020-02-22 DIAGNOSIS — M2550 Pain in unspecified joint: Secondary | ICD-10-CM | POA: Diagnosis not present

## 2020-02-23 DIAGNOSIS — Z79890 Hormone replacement therapy (postmenopausal): Secondary | ICD-10-CM | POA: Diagnosis not present

## 2020-02-23 DIAGNOSIS — Z125 Encounter for screening for malignant neoplasm of prostate: Secondary | ICD-10-CM | POA: Diagnosis not present

## 2020-03-16 DIAGNOSIS — H16043 Marginal corneal ulcer, bilateral: Secondary | ICD-10-CM | POA: Diagnosis not present

## 2020-03-16 DIAGNOSIS — H01009 Unspecified blepharitis unspecified eye, unspecified eyelid: Secondary | ICD-10-CM | POA: Diagnosis not present

## 2020-03-28 DIAGNOSIS — H16043 Marginal corneal ulcer, bilateral: Secondary | ICD-10-CM | POA: Diagnosis not present

## 2020-05-11 DIAGNOSIS — H16043 Marginal corneal ulcer, bilateral: Secondary | ICD-10-CM | POA: Diagnosis not present

## 2020-08-31 ENCOUNTER — Other Ambulatory Visit: Payer: Self-pay | Admitting: Internal Medicine

## 2020-08-31 DIAGNOSIS — Z0001 Encounter for general adult medical examination with abnormal findings: Secondary | ICD-10-CM | POA: Diagnosis not present

## 2020-08-31 DIAGNOSIS — E7800 Pure hypercholesterolemia, unspecified: Secondary | ICD-10-CM

## 2020-08-31 DIAGNOSIS — R3129 Other microscopic hematuria: Secondary | ICD-10-CM | POA: Diagnosis not present

## 2020-08-31 DIAGNOSIS — Z0000 Encounter for general adult medical examination without abnormal findings: Secondary | ICD-10-CM | POA: Diagnosis not present

## 2020-08-31 DIAGNOSIS — Z23 Encounter for immunization: Secondary | ICD-10-CM | POA: Diagnosis not present

## 2020-08-31 DIAGNOSIS — L570 Actinic keratosis: Secondary | ICD-10-CM | POA: Diagnosis not present

## 2020-08-31 DIAGNOSIS — N529 Male erectile dysfunction, unspecified: Secondary | ICD-10-CM | POA: Diagnosis not present

## 2020-08-31 DIAGNOSIS — K219 Gastro-esophageal reflux disease without esophagitis: Secondary | ICD-10-CM | POA: Diagnosis not present

## 2020-09-02 NOTE — Progress Notes (Deleted)
Assessment/Plan:   *** Subjective:   Tyler Arnold was seen today in neurologic consultation at the request of Deland Pretty, MD.  The consultation is for the evaluation of abnormal involuntary movements.  Medical records from primary care are reviewed.  Patient wondering if he has tardive dyskinesia, as his brother and sister both have the diagnosis.  Patient reports that movements are worse when he sits still and better if moving or working on something.  Neuroimaging has not previously been performed.  It *** available for my review today.  PREVIOUS MEDICATIONS: ***  ALLERGIES:   Allergies  Allergen Reactions  . Penicillins Swelling, Itching, Anaphylaxis and Rash    Possible closing of throat. Slight swelling of throat and tongue Slight swelling of throat and tongue     CURRENT MEDICATIONS:  Outpatient Encounter Medications as of 09/05/2020  Medication Sig  . atorvastatin (LIPITOR) 20 MG tablet Take 20 mg by mouth daily.  . busPIRone (BUSPAR) 7.5 MG tablet Take 15 mg by mouth 2 (two) times daily.  . ciprofloxacin (CIPRO) 500 MG tablet Take 1 tablet (500 mg total) by mouth 2 (two) times daily.  . cyanocobalamin (,VITAMIN B-12,) 1000 MCG/ML injection Inject 1,000 mcg into the muscle every 30 (thirty) days.  Marland Kitchen etanercept (ENBREL) 50 MG/ML injection Inject 50 mg into the skin once a week.  . famotidine (PEPCID) 20 MG tablet Take 20 mg by mouth at bedtime as needed for heartburn or indigestion.  . fluticasone (FLONASE) 50 MCG/ACT nasal spray Place 2 sprays into both nostrils daily.  . multivitamin (ONE-A-DAY MEN'S) TABS tablet Take 1 tablet by mouth daily.  . naproxen (NAPROSYN) 250 MG tablet Take 250 mg by mouth 2 (two) times daily with a meal.  . Omega-3 Fatty Acids (FISH OIL) 1000 MG CAPS Take 1,000 mg by mouth 2 (two) times daily.  . sildenafil (REVATIO) 20 MG tablet Take 40-100 mg by mouth daily as needed (Erectile Dysfunction).  . Testosterone 25 MG/2.5GM (1%) GEL Place 1  Pump onto the skin See admin instructions. Apply 1 pump under each arm daily.   No facility-administered encounter medications on file as of 09/05/2020.    Objective:   PHYSICAL EXAMINATION:    VITALS:  There were no vitals filed for this visit.  GEN:  Normal appears male in no acute distress.  Appears stated age. HEENT:  Normocephalic, atraumatic. The mucous membranes are moist. The superficial temporal arteries are without ropiness or tenderness. Cardiovascular: Regular rate and rhythm. Lungs: Clear to auscultation bilaterally. Neck/Heme: There are no carotid bruits noted bilaterally.  NEUROLOGICAL: Orientation:  The patient is alert and oriented x 3.   Cranial nerves: There is good facial symmetry.  Extraocular muscles are intact and visual fields are full to confrontational testing. Speech is fluent and clear. Soft palate rises symmetrically and there is no tongue deviation. Hearing is intact to conversational tone. Tone: Tone is good throughout. Sensation: Sensation is intact to light touch and pinprick throughout (facial, trunk, extremities). Vibration is intact at the bilateral big toe. There is no extinction with double simultaneous stimulation. There is no sensory dermatomal level identified. Coordination:  The patient has no difficulty with RAM's or FNF bilaterally. Motor: Strength is 5/5 in the bilateral upper and lower extremities.  Shoulder shrug is equal and symmetric. There is no pronator drift.  There are no fasciculations noted. DTR's: Deep tendon reflexes are 2/4 at the bilateral biceps, triceps, brachioradialis, patella and achilles.  Plantar responses are downgoing bilaterally. Gait and  Station: The patient is able to ambulate without difficulty. The patient is able to heel toe walk without any difficulty. The patient is able to ambulate in a tandem fashion. The patient is able to stand in the Romberg position.  I have reviewed and interpreted the following labs  independently  Patient patient had lab work on August 31, 2020.  White blood cells were 6.9, hemoglobin 16.9, hematocrit 43.4 and platelets 245.  Sodium was 143, potassium 4.9, BUN 23, creatinine 1.4, chloride 105, CO2 23, AST 22, ALT 25  Total time spent on today's visit was ***greater than 60 minutes, including both face-to-face time and nonface-to-face time.  Time included that spent on review of records (prior notes available to me/labs/imaging if pertinent), discussing treatment and goals, answering patient's questions and coordinating care.   Cc:  Deland Pretty, MD

## 2020-09-05 ENCOUNTER — Ambulatory Visit: Payer: BC Managed Care – PPO | Admitting: Neurology

## 2020-09-05 NOTE — Progress Notes (Signed)
Assessment/Plan:   1.  Abnormal involuntary movements  -I actually did not see any movements today.  I reassured him that he most certainly did not have "tardive dyskinesia."  He thought he had this based on commercials for Austedo.  Explained to the patient that this diagnosis would imply that he was on a medication that caused the symptoms and his symptoms have been going on for over 30 years and he has never been on an antipsychotic or reglan.  -Patient reports family history of similar.  This could imply a diagnosis of hereditary chorea (although I would expect to have seen that today).  He did tell me that he was able to suppress symptoms and at the very end of the visit showed me what the symptoms would look like.  These looked much more like simple motor tics.  I would do some lab work today.  We will also do an MRI of the brain.  Overall, I told the patient that I really would not recommend any medication.  He was just placed on sertraline, and that may help somewhat.  In addition, he has had this for 30 or 40 years and it has not gotten worse.  Discussed with the patient that medications such as Austedo (that was the medication that he saw on the TV for tardive dyskinesia) can cause side effects such as parkinsonism, and I do not think that they are indicated in this case.  Patient agreed.  To be done today: Labs, ceruloplasmin, copper,TSH, PTH, ferritin, sedimentation rate, ANA, antiphospholipid antibody, lupus anticoagulant, RPR, B12, antigliadin antibody  MRI brain will be scheduled.  Discussed with patient that we likely will see some atrophy and small vessel disease.  As long as we do not see anything unexpected, then I will see him back on an as-needed basis.  Patient agreeable.   Subjective:   Tyler Arnold was seen today in neurologic consultation at the request of Deland Pretty, MD.  The consultation is for the evaluation of abnormal involuntary movements.  Medical records from  primary care are reviewed.  Patient wondering if he has tardive dyskinesia, as his brother and sister both have the diagnosis.  Patient reports that movements are worse when he sits still and better if moving or working on something.  Pt states that he has had shoulder/neck/facial movements for "years and years and years" and he saw the commercial on [employer].  He thinks that it has been going on for 30-40 years without significant change.  He admits to anxiety and "I worry a lot."  Recently put on sertraline.  Prior on buspar.  No hx of reglan use.  No other antidepressants.  Thinks that "all of the family" has movements.  Sister had it but died few months ago of CAD.  He is able to suppress the movements.  They do not generally happen when he is with his friends because of this.  If he is driving alone in the car, he will have a lot of them.  They may happen in the face or upper trunk/neck.  Neuroimaging has not previously been performed.     ALLERGIES:   Allergies  Allergen Reactions  . Penicillins Swelling, Itching, Anaphylaxis and Rash    Possible closing of throat. Slight swelling of throat and tongue Slight swelling of throat and tongue     CURRENT MEDICATIONS:  Outpatient Encounter Medications as of 09/08/2020  Medication Sig  . atorvastatin (LIPITOR) 20 MG tablet Take 20 mg  by mouth daily.  . busPIRone (BUSPAR) 7.5 MG tablet Take 15 mg by mouth 2 (two) times daily.  . cyanocobalamin (,VITAMIN B-12,) 1000 MCG/ML injection Inject 1,000 mcg into the muscle every 30 (thirty) days.  Marland Kitchen etanercept (ENBREL) 50 MG/ML injection Inject 50 mg into the skin once a week.  . famotidine (PEPCID) 20 MG tablet Take 20 mg by mouth at bedtime as needed for heartburn or indigestion.  . fluticasone (FLONASE) 50 MCG/ACT nasal spray Place 2 sprays into both nostrils 2 (two) times daily.  . naproxen (NAPROSYN) 250 MG tablet Take 250 mg by mouth as needed.  . Omega-3 Fatty Acids (FISH OIL) 1000 MG CAPS Take  1,000 mg by mouth 2 (two) times daily.  . sildenafil (REVATIO) 20 MG tablet Take 40-100 mg by mouth daily as needed (Erectile Dysfunction).  . Testosterone 25 MG/2.5GM (1%) GEL Place 1 Pump onto the skin See admin instructions. Apply 1 pump under each arm daily.  . sertraline (ZOLOFT) 50 MG tablet Take 1 tablet by mouth daily.  . [DISCONTINUED] ciprofloxacin (CIPRO) 500 MG tablet Take 1 tablet (500 mg total) by mouth 2 (two) times daily. (Patient not taking: Reported on 09/08/2020)  . [DISCONTINUED] multivitamin (ONE-A-DAY MEN'S) TABS tablet Take 1 tablet by mouth daily. (Patient not taking: Reported on 09/08/2020)   No facility-administered encounter medications on file as of 09/08/2020.    Objective:   PHYSICAL EXAMINATION:    VITALS:   Vitals:   09/08/20 0956  BP: 114/70  Pulse: 83  SpO2: 98%  Weight: 202 lb (91.6 kg)  Height: 5\' 11"  (1.803 m)    GEN:  Normal appears male in no acute distress.  Appears stated age. HEENT:  Normocephalic, atraumatic. The mucous membranes are moist. The superficial temporal arteries are without ropiness or tenderness. Cardiovascular: Regular rate and rhythm. Lungs: Clear to auscultation bilaterally. Neck/Heme: There are no carotid bruits noted bilaterally.  NEUROLOGICAL: Orientation:  The patient is alert and oriented x 3.   Cranial nerves: There is good facial symmetry.  Extraocular muscles are intact and visual fields are full to confrontational testing. Speech is fluent and clear. Soft palate rises symmetrically and there is no tongue deviation. Hearing is intact to conversational tone. Tone: Tone is good throughout. Sensation: Sensation is intact to light touch and pinprick throughout (facial, trunk, extremities). Vibration is intact at the bilateral big toe. There is no extinction with double simultaneous stimulation. There is no sensory dermatomal level identified. Coordination:  The patient has no difficulty with RAM's or FNF  bilaterally. Motor: Strength is 5/5 in the bilateral upper and lower extremities.  Shoulder shrug is equal and symmetric. There is no pronator drift.  There are no fasciculations noted. DTR's: Deep tendon reflexes are 2-/4 at the bilateral biceps, triceps, brachioradialis, patella and achilles.  Plantar responses are downgoing bilaterally. Gait and Station: The patient is able to ambulate without difficulty. The patient is able to heel toe walk without any difficulty. The patient is able to ambulate in a tandem fashion. The patient is able to stand in the Romberg position. Abnormal movements: None seen (as above, at the end of the visit the patient did try to show me what the movements might look like.  He showed me some rapid blinking and some quick jerks of the neck, stating that he can suppress the movements).  There was no chorea.  There was no tremor.  There was no myoclonus or asterixis.  I have reviewed and interpreted the following labs  independently  Patient patient had lab work on August 31, 2020.  White blood cells were 6.9, hemoglobin 16.9, hematocrit 43.4 and platelets 245.  Sodium was 143, potassium 4.9, BUN 23, creatinine 1.4, chloride 105, CO2 23, AST 22, ALT 25  Total time spent on today's visit was 45 minutes, including both face-to-face time and nonface-to-face time.  Time included that spent on review of records (prior notes available to me/labs/imaging if pertinent), discussing treatment and goals, answering patient's questions and coordinating care.   Cc:  Deland Pretty, MD

## 2020-09-08 ENCOUNTER — Ambulatory Visit (INDEPENDENT_AMBULATORY_CARE_PROVIDER_SITE_OTHER): Payer: BC Managed Care – PPO | Admitting: Neurology

## 2020-09-08 ENCOUNTER — Encounter: Payer: Self-pay | Admitting: Neurology

## 2020-09-08 ENCOUNTER — Other Ambulatory Visit (INDEPENDENT_AMBULATORY_CARE_PROVIDER_SITE_OTHER): Payer: BC Managed Care – PPO

## 2020-09-08 ENCOUNTER — Other Ambulatory Visit: Payer: Self-pay

## 2020-09-08 VITALS — BP 114/70 | HR 83 | Ht 71.0 in | Wt 202.0 lb

## 2020-09-08 DIAGNOSIS — Z5181 Encounter for therapeutic drug level monitoring: Secondary | ICD-10-CM

## 2020-09-08 DIAGNOSIS — R5383 Other fatigue: Secondary | ICD-10-CM

## 2020-09-08 DIAGNOSIS — G2569 Other tics of organic origin: Secondary | ICD-10-CM

## 2020-09-08 DIAGNOSIS — G255 Other chorea: Secondary | ICD-10-CM

## 2020-09-08 DIAGNOSIS — R251 Tremor, unspecified: Secondary | ICD-10-CM | POA: Diagnosis not present

## 2020-09-08 DIAGNOSIS — R6889 Other general symptoms and signs: Secondary | ICD-10-CM

## 2020-09-08 LAB — FERRITIN: Ferritin: 64.9 ng/mL (ref 22.0–322.0)

## 2020-09-08 LAB — TSH: TSH: 2.23 u[IU]/mL (ref 0.35–4.50)

## 2020-09-08 NOTE — Patient Instructions (Addendum)
Your provider has requested that you have labwork completed today. Please go to [facility] ([id number]) on the second floor of this building before leaving the office today. You do not need to check in. If you are not called within 15 minutes please check with the front desk.    Your physician recommends that you schedule a follow-up appointment in: will be determined after all of your testing has been completed.

## 2020-09-09 LAB — LUPUS ANTICOAGULANT
Dilute Viper Venom Time: 40.6 s (ref 0.0–47.0)
PTT Lupus Anticoagulant: 31.3 s (ref 0.0–51.9)
Thrombin Time: 18.2 s (ref 0.0–23.0)
dPT Confirm Ratio: 1.21 Ratio (ref 0.00–1.34)
dPT: 40.5 s (ref 0.0–47.6)

## 2020-09-13 LAB — COPPER, SERUM: Copper: 112 ug/dL (ref 70–175)

## 2020-09-13 LAB — ANTIPHOSPHOLOPID AB PANEL
Anticardiolipin IgA: <2 APL-U/mL (ref <20.0)
Anticardiolipin IgG: <2 GPL-U/mL (ref <20.0)
Anticardiolipin IgM: 3.9 MPL-U/mL (ref <20.0)
Beta-2 Glyco 1 IgA: <2 U/mL (ref <20.0)
Beta-2 Glyco 1 IgM: 4 U/mL (ref <20.0)
Beta-2 Glyco I IgG: <2 U/mL (ref <20.0)
PHOSPHATIDYLSERINE AB  (IGG): 30 U/mL — ABNORMAL HIGH (ref <10)
PHOSPHATIDYLSERINE AB (IGA): <20 U/mL (ref <20)

## 2020-09-13 LAB — CERULOPLASMIN: Ceruloplasmin: 30 mg/dL (ref 18–36)

## 2020-09-13 LAB — ANTI-NUCLEAR AB-TITER (ANA TITER): ANA Titer 1: 1:40 {titer} — ABNORMAL HIGH

## 2020-09-13 LAB — GLIADIN ANTIBODIES, SERUM
Gliadin IgA: 2.1 U/mL
Gliadin IgG: <1 U/mL

## 2020-09-13 LAB — PTH, INTACT AND CALCIUM
Calcium: 9.9 mg/dL (ref 8.6–10.3)
PTH: 12 pg/mL — ABNORMAL LOW (ref 14–64)

## 2020-09-13 LAB — RPR: RPR Ser Ql: NONREACTIVE

## 2020-09-13 LAB — ANA: Anti Nuclear Antibody (ANA): POSITIVE — AB

## 2020-09-15 ENCOUNTER — Other Ambulatory Visit: Payer: Self-pay | Admitting: Neurology

## 2020-09-15 DIAGNOSIS — G255 Other chorea: Secondary | ICD-10-CM

## 2020-09-16 ENCOUNTER — Ambulatory Visit
Admission: RE | Admit: 2020-09-16 | Discharge: 2020-09-16 | Disposition: A | Discharge status: Home / Self Care | Payer: No Typology Code available for payment source | Source: Ambulatory Visit | Attending: Internal Medicine | Admitting: Internal Medicine

## 2020-09-16 DIAGNOSIS — E7800 Pure hypercholesterolemia, unspecified: Secondary | ICD-10-CM

## 2020-09-19 ENCOUNTER — Ambulatory Visit
Admission: RE | Admit: 2020-09-19 | Discharge: 2020-09-19 | Disposition: A | Discharge status: Home / Self Care | Payer: BC Managed Care – PPO | Source: Ambulatory Visit | Attending: Neurology | Admitting: Neurology

## 2020-09-19 ENCOUNTER — Other Ambulatory Visit: Payer: Self-pay

## 2020-09-19 DIAGNOSIS — J3489 Other specified disorders of nose and nasal sinuses: Secondary | ICD-10-CM | POA: Diagnosis not present

## 2020-09-19 DIAGNOSIS — M795 Residual foreign body in soft tissue: Secondary | ICD-10-CM | POA: Diagnosis not present

## 2020-09-19 DIAGNOSIS — S90552A Superficial foreign body, left ankle, initial encounter: Secondary | ICD-10-CM | POA: Diagnosis not present

## 2020-09-19 DIAGNOSIS — J329 Chronic sinusitis, unspecified: Secondary | ICD-10-CM | POA: Diagnosis not present

## 2020-09-19 DIAGNOSIS — G255 Other chorea: Secondary | ICD-10-CM

## 2020-09-19 DIAGNOSIS — G10 Huntington's disease: Secondary | ICD-10-CM | POA: Diagnosis not present

## 2020-09-20 ENCOUNTER — Telehealth: Payer: Self-pay | Admitting: Neurology

## 2020-09-20 NOTE — Telephone Encounter (Signed)
Let pt know that MRI brain looks normal.  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  These aren't harmful and are usually a bit difficult to treat if patients choose treatment.  He can let me know if things worsen or new neurologic issues arise but for right now, I would not recommend any treatment as I think that risks of treatment outweigh benefits in him.

## 2020-09-20 NOTE — Telephone Encounter (Signed)
Left message for patient to contact office.

## 2020-09-21 NOTE — Telephone Encounter (Signed)
Patient notified directly and voiced understanding.  

## 2020-09-28 DIAGNOSIS — F419 Anxiety disorder, unspecified: Secondary | ICD-10-CM | POA: Diagnosis not present

## 2020-09-28 DIAGNOSIS — I2584 Coronary atherosclerosis due to calcified coronary lesion: Secondary | ICD-10-CM | POA: Diagnosis not present

## 2020-09-28 DIAGNOSIS — I2510 Atherosclerotic heart disease of native coronary artery without angina pectoris: Secondary | ICD-10-CM | POA: Diagnosis not present

## 2020-10-21 DIAGNOSIS — M2550 Pain in unspecified joint: Secondary | ICD-10-CM | POA: Diagnosis not present

## 2020-10-21 DIAGNOSIS — M0579 Rheumatoid arthritis with rheumatoid factor of multiple sites without organ or systems involvement: Secondary | ICD-10-CM | POA: Diagnosis not present

## 2020-10-21 DIAGNOSIS — Z111 Encounter for screening for respiratory tuberculosis: Secondary | ICD-10-CM | POA: Diagnosis not present

## 2020-10-21 DIAGNOSIS — R5383 Other fatigue: Secondary | ICD-10-CM | POA: Diagnosis not present

## 2020-10-21 DIAGNOSIS — Z79899 Other long term (current) drug therapy: Secondary | ICD-10-CM | POA: Diagnosis not present

## 2020-11-09 DIAGNOSIS — I2510 Atherosclerotic heart disease of native coronary artery without angina pectoris: Secondary | ICD-10-CM | POA: Diagnosis not present

## 2020-11-09 DIAGNOSIS — Z23 Encounter for immunization: Secondary | ICD-10-CM | POA: Diagnosis not present

## 2021-08-03 DIAGNOSIS — J3489 Other specified disorders of nose and nasal sinuses: Secondary | ICD-10-CM | POA: Diagnosis not present

## 2021-08-31 DIAGNOSIS — R3129 Other microscopic hematuria: Secondary | ICD-10-CM | POA: Diagnosis not present

## 2021-08-31 DIAGNOSIS — Z1289 Encounter for screening for malignant neoplasm of other sites: Secondary | ICD-10-CM | POA: Diagnosis not present

## 2021-08-31 DIAGNOSIS — I2510 Atherosclerotic heart disease of native coronary artery without angina pectoris: Secondary | ICD-10-CM | POA: Diagnosis not present

## 2021-08-31 DIAGNOSIS — E7800 Pure hypercholesterolemia, unspecified: Secondary | ICD-10-CM | POA: Diagnosis not present

## 2021-08-31 DIAGNOSIS — Z7982 Long term (current) use of aspirin: Secondary | ICD-10-CM | POA: Diagnosis not present

## 2021-09-06 DIAGNOSIS — Z0000 Encounter for general adult medical examination without abnormal findings: Secondary | ICD-10-CM | POA: Diagnosis not present

## 2021-09-06 DIAGNOSIS — K219 Gastro-esophageal reflux disease without esophagitis: Secondary | ICD-10-CM | POA: Diagnosis not present

## 2021-09-06 DIAGNOSIS — D849 Immunodeficiency, unspecified: Secondary | ICD-10-CM | POA: Diagnosis not present

## 2021-09-06 DIAGNOSIS — M0589 Other rheumatoid arthritis with rheumatoid factor of multiple sites: Secondary | ICD-10-CM | POA: Diagnosis not present

## 2021-09-06 DIAGNOSIS — J309 Allergic rhinitis, unspecified: Secondary | ICD-10-CM | POA: Diagnosis not present

## 2021-09-06 DIAGNOSIS — I2510 Atherosclerotic heart disease of native coronary artery without angina pectoris: Secondary | ICD-10-CM | POA: Diagnosis not present

## 2021-09-06 DIAGNOSIS — Z79899 Other long term (current) drug therapy: Secondary | ICD-10-CM | POA: Diagnosis not present

## 2021-09-06 DIAGNOSIS — Z1212 Encounter for screening for malignant neoplasm of rectum: Secondary | ICD-10-CM | POA: Diagnosis not present

## 2021-10-04 DIAGNOSIS — I2510 Atherosclerotic heart disease of native coronary artery without angina pectoris: Secondary | ICD-10-CM | POA: Diagnosis not present

## 2021-10-18 DIAGNOSIS — H2513 Age-related nuclear cataract, bilateral: Secondary | ICD-10-CM | POA: Diagnosis not present

## 2021-10-18 DIAGNOSIS — Q141 Congenital malformation of retina: Secondary | ICD-10-CM | POA: Diagnosis not present

## 2021-10-18 DIAGNOSIS — H16223 Keratoconjunctivitis sicca, not specified as Sjogren's, bilateral: Secondary | ICD-10-CM | POA: Diagnosis not present

## 2021-10-18 DIAGNOSIS — H524 Presbyopia: Secondary | ICD-10-CM | POA: Diagnosis not present

## 2021-10-18 DIAGNOSIS — H52223 Regular astigmatism, bilateral: Secondary | ICD-10-CM | POA: Diagnosis not present

## 2021-10-18 DIAGNOSIS — H5203 Hypermetropia, bilateral: Secondary | ICD-10-CM | POA: Diagnosis not present

## 2021-11-13 DIAGNOSIS — K219 Gastro-esophageal reflux disease without esophagitis: Secondary | ICD-10-CM | POA: Diagnosis not present

## 2021-11-13 DIAGNOSIS — Z01818 Encounter for other preprocedural examination: Secondary | ICD-10-CM | POA: Diagnosis not present

## 2021-11-28 DIAGNOSIS — H18461 Peripheral corneal degeneration, right eye: Secondary | ICD-10-CM | POA: Diagnosis not present

## 2021-12-04 DIAGNOSIS — H18461 Peripheral corneal degeneration, right eye: Secondary | ICD-10-CM | POA: Diagnosis not present

## 2021-12-12 DIAGNOSIS — M0579 Rheumatoid arthritis with rheumatoid factor of multiple sites without organ or systems involvement: Secondary | ICD-10-CM | POA: Diagnosis not present

## 2021-12-12 DIAGNOSIS — M1991 Primary osteoarthritis, unspecified site: Secondary | ICD-10-CM | POA: Diagnosis not present

## 2021-12-12 DIAGNOSIS — R5383 Other fatigue: Secondary | ICD-10-CM | POA: Diagnosis not present

## 2021-12-12 DIAGNOSIS — Z79899 Other long term (current) drug therapy: Secondary | ICD-10-CM | POA: Diagnosis not present

## 2022-03-01 DIAGNOSIS — Z681 Body mass index (BMI) 19 or less, adult: Secondary | ICD-10-CM | POA: Diagnosis not present

## 2022-03-01 DIAGNOSIS — J0140 Acute pansinusitis, unspecified: Secondary | ICD-10-CM | POA: Diagnosis not present

## 2022-03-12 DIAGNOSIS — Z1211 Encounter for screening for malignant neoplasm of colon: Secondary | ICD-10-CM | POA: Diagnosis not present

## 2022-03-12 DIAGNOSIS — K635 Polyp of colon: Secondary | ICD-10-CM | POA: Diagnosis not present

## 2022-04-23 DIAGNOSIS — Z6824 Body mass index (BMI) 24.0-24.9, adult: Secondary | ICD-10-CM | POA: Diagnosis not present

## 2022-04-23 DIAGNOSIS — B9689 Other specified bacterial agents as the cause of diseases classified elsewhere: Secondary | ICD-10-CM | POA: Diagnosis not present

## 2022-04-23 DIAGNOSIS — Z23 Encounter for immunization: Secondary | ICD-10-CM | POA: Diagnosis not present

## 2022-04-23 DIAGNOSIS — J208 Acute bronchitis due to other specified organisms: Secondary | ICD-10-CM | POA: Diagnosis not present

## 2022-04-23 DIAGNOSIS — E7800 Pure hypercholesterolemia, unspecified: Secondary | ICD-10-CM | POA: Diagnosis not present

## 2022-04-23 DIAGNOSIS — R051 Acute cough: Secondary | ICD-10-CM | POA: Diagnosis not present

## 2022-05-09 DIAGNOSIS — D1801 Hemangioma of skin and subcutaneous tissue: Secondary | ICD-10-CM | POA: Diagnosis not present

## 2022-05-09 DIAGNOSIS — L814 Other melanin hyperpigmentation: Secondary | ICD-10-CM | POA: Diagnosis not present

## 2022-05-09 DIAGNOSIS — D229 Melanocytic nevi, unspecified: Secondary | ICD-10-CM | POA: Diagnosis not present

## 2022-05-09 DIAGNOSIS — L570 Actinic keratosis: Secondary | ICD-10-CM | POA: Diagnosis not present

## 2022-05-09 DIAGNOSIS — L821 Other seborrheic keratosis: Secondary | ICD-10-CM | POA: Diagnosis not present

## 2022-06-26 DIAGNOSIS — M0579 Rheumatoid arthritis with rheumatoid factor of multiple sites without organ or systems involvement: Secondary | ICD-10-CM | POA: Diagnosis not present

## 2022-06-26 DIAGNOSIS — M1991 Primary osteoarthritis, unspecified site: Secondary | ICD-10-CM | POA: Diagnosis not present

## 2022-06-26 DIAGNOSIS — Z79899 Other long term (current) drug therapy: Secondary | ICD-10-CM | POA: Diagnosis not present

## 2022-07-13 DIAGNOSIS — Z79899 Other long term (current) drug therapy: Secondary | ICD-10-CM | POA: Diagnosis not present

## 2022-07-13 DIAGNOSIS — M0579 Rheumatoid arthritis with rheumatoid factor of multiple sites without organ or systems involvement: Secondary | ICD-10-CM | POA: Diagnosis not present

## 2022-10-02 DIAGNOSIS — Z681 Body mass index (BMI) 19 or less, adult: Secondary | ICD-10-CM | POA: Diagnosis not present

## 2022-10-02 DIAGNOSIS — B9689 Other specified bacterial agents as the cause of diseases classified elsewhere: Secondary | ICD-10-CM | POA: Diagnosis not present

## 2022-10-02 DIAGNOSIS — J208 Acute bronchitis due to other specified organisms: Secondary | ICD-10-CM | POA: Diagnosis not present

## 2022-10-02 DIAGNOSIS — R0602 Shortness of breath: Secondary | ICD-10-CM | POA: Diagnosis not present

## 2022-10-02 DIAGNOSIS — R051 Acute cough: Secondary | ICD-10-CM | POA: Diagnosis not present

## 2022-10-04 DIAGNOSIS — I2510 Atherosclerotic heart disease of native coronary artery without angina pectoris: Secondary | ICD-10-CM | POA: Diagnosis not present

## 2022-10-04 DIAGNOSIS — E7800 Pure hypercholesterolemia, unspecified: Secondary | ICD-10-CM | POA: Diagnosis not present

## 2022-10-09 DIAGNOSIS — K219 Gastro-esophageal reflux disease without esophagitis: Secondary | ICD-10-CM | POA: Diagnosis not present

## 2022-10-09 DIAGNOSIS — M0589 Other rheumatoid arthritis with rheumatoid factor of multiple sites: Secondary | ICD-10-CM | POA: Diagnosis not present

## 2022-10-09 DIAGNOSIS — Z0000 Encounter for general adult medical examination without abnormal findings: Secondary | ICD-10-CM | POA: Diagnosis not present

## 2022-10-09 DIAGNOSIS — R809 Proteinuria, unspecified: Secondary | ICD-10-CM | POA: Diagnosis not present

## 2022-10-09 DIAGNOSIS — I2510 Atherosclerotic heart disease of native coronary artery without angina pectoris: Secondary | ICD-10-CM | POA: Diagnosis not present

## 2022-10-09 DIAGNOSIS — R3129 Other microscopic hematuria: Secondary | ICD-10-CM | POA: Diagnosis not present

## 2022-10-09 DIAGNOSIS — Z79899 Other long term (current) drug therapy: Secondary | ICD-10-CM | POA: Diagnosis not present

## 2022-10-09 DIAGNOSIS — J309 Allergic rhinitis, unspecified: Secondary | ICD-10-CM | POA: Diagnosis not present

## 2022-12-27 DIAGNOSIS — L570 Actinic keratosis: Secondary | ICD-10-CM | POA: Diagnosis not present

## 2023-01-07 DIAGNOSIS — Z79899 Other long term (current) drug therapy: Secondary | ICD-10-CM | POA: Diagnosis not present

## 2023-01-07 DIAGNOSIS — R5383 Other fatigue: Secondary | ICD-10-CM | POA: Diagnosis not present

## 2023-01-07 DIAGNOSIS — M1991 Primary osteoarthritis, unspecified site: Secondary | ICD-10-CM | POA: Diagnosis not present

## 2023-01-07 DIAGNOSIS — M0579 Rheumatoid arthritis with rheumatoid factor of multiple sites without organ or systems involvement: Secondary | ICD-10-CM | POA: Diagnosis not present

## 2023-02-06 DIAGNOSIS — M545 Low back pain, unspecified: Secondary | ICD-10-CM | POA: Diagnosis not present

## 2023-02-06 DIAGNOSIS — Z6824 Body mass index (BMI) 24.0-24.9, adult: Secondary | ICD-10-CM | POA: Diagnosis not present

## 2023-02-06 DIAGNOSIS — E785 Hyperlipidemia, unspecified: Secondary | ICD-10-CM | POA: Diagnosis not present

## 2023-02-25 DIAGNOSIS — L570 Actinic keratosis: Secondary | ICD-10-CM | POA: Diagnosis not present

## 2023-06-06 DIAGNOSIS — D3131 Benign neoplasm of right choroid: Secondary | ICD-10-CM | POA: Diagnosis not present

## 2023-06-06 DIAGNOSIS — H2513 Age-related nuclear cataract, bilateral: Secondary | ICD-10-CM | POA: Diagnosis not present

## 2023-07-26 DIAGNOSIS — M1991 Primary osteoarthritis, unspecified site: Secondary | ICD-10-CM | POA: Diagnosis not present

## 2023-07-26 DIAGNOSIS — Z79899 Other long term (current) drug therapy: Secondary | ICD-10-CM | POA: Diagnosis not present

## 2023-07-26 DIAGNOSIS — M0579 Rheumatoid arthritis with rheumatoid factor of multiple sites without organ or systems involvement: Secondary | ICD-10-CM | POA: Diagnosis not present

## 2023-08-29 DIAGNOSIS — G5701 Lesion of sciatic nerve, right lower limb: Secondary | ICD-10-CM | POA: Diagnosis not present

## 2023-09-10 DIAGNOSIS — G5701 Lesion of sciatic nerve, right lower limb: Secondary | ICD-10-CM | POA: Diagnosis not present

## 2023-09-10 DIAGNOSIS — M5431 Sciatica, right side: Secondary | ICD-10-CM | POA: Diagnosis not present

## 2023-09-11 DIAGNOSIS — G5701 Lesion of sciatic nerve, right lower limb: Secondary | ICD-10-CM | POA: Diagnosis not present

## 2023-09-11 DIAGNOSIS — M5431 Sciatica, right side: Secondary | ICD-10-CM | POA: Diagnosis not present

## 2023-09-20 DIAGNOSIS — G5701 Lesion of sciatic nerve, right lower limb: Secondary | ICD-10-CM | POA: Diagnosis not present

## 2023-09-20 DIAGNOSIS — M5431 Sciatica, right side: Secondary | ICD-10-CM | POA: Diagnosis not present

## 2023-09-24 DIAGNOSIS — M5431 Sciatica, right side: Secondary | ICD-10-CM | POA: Diagnosis not present

## 2023-09-24 DIAGNOSIS — G5701 Lesion of sciatic nerve, right lower limb: Secondary | ICD-10-CM | POA: Diagnosis not present

## 2023-09-26 DIAGNOSIS — M5431 Sciatica, right side: Secondary | ICD-10-CM | POA: Diagnosis not present

## 2023-09-26 DIAGNOSIS — G5701 Lesion of sciatic nerve, right lower limb: Secondary | ICD-10-CM | POA: Diagnosis not present

## 2023-10-01 DIAGNOSIS — M5431 Sciatica, right side: Secondary | ICD-10-CM | POA: Diagnosis not present

## 2023-10-01 DIAGNOSIS — G5701 Lesion of sciatic nerve, right lower limb: Secondary | ICD-10-CM | POA: Diagnosis not present

## 2023-10-03 DIAGNOSIS — G5701 Lesion of sciatic nerve, right lower limb: Secondary | ICD-10-CM | POA: Diagnosis not present

## 2023-10-03 DIAGNOSIS — M5431 Sciatica, right side: Secondary | ICD-10-CM | POA: Diagnosis not present

## 2023-10-09 DIAGNOSIS — I2510 Atherosclerotic heart disease of native coronary artery without angina pectoris: Secondary | ICD-10-CM | POA: Diagnosis not present

## 2023-10-09 DIAGNOSIS — M5431 Sciatica, right side: Secondary | ICD-10-CM | POA: Diagnosis not present

## 2023-10-09 DIAGNOSIS — G5701 Lesion of sciatic nerve, right lower limb: Secondary | ICD-10-CM | POA: Diagnosis not present

## 2023-10-14 DIAGNOSIS — L570 Actinic keratosis: Secondary | ICD-10-CM | POA: Diagnosis not present

## 2023-10-14 DIAGNOSIS — R3129 Other microscopic hematuria: Secondary | ICD-10-CM | POA: Diagnosis not present

## 2023-10-14 DIAGNOSIS — J309 Allergic rhinitis, unspecified: Secondary | ICD-10-CM | POA: Diagnosis not present

## 2023-10-14 DIAGNOSIS — Z0000 Encounter for general adult medical examination without abnormal findings: Secondary | ICD-10-CM | POA: Diagnosis not present

## 2023-10-14 DIAGNOSIS — D849 Immunodeficiency, unspecified: Secondary | ICD-10-CM | POA: Diagnosis not present

## 2023-10-14 DIAGNOSIS — M0589 Other rheumatoid arthritis with rheumatoid factor of multiple sites: Secondary | ICD-10-CM | POA: Diagnosis not present

## 2023-10-14 DIAGNOSIS — L578 Other skin changes due to chronic exposure to nonionizing radiation: Secondary | ICD-10-CM | POA: Diagnosis not present

## 2023-10-14 DIAGNOSIS — K219 Gastro-esophageal reflux disease without esophagitis: Secondary | ICD-10-CM | POA: Diagnosis not present

## 2023-10-14 DIAGNOSIS — I2510 Atherosclerotic heart disease of native coronary artery without angina pectoris: Secondary | ICD-10-CM | POA: Diagnosis not present

## 2023-10-23 DIAGNOSIS — M5431 Sciatica, right side: Secondary | ICD-10-CM | POA: Diagnosis not present

## 2023-10-23 DIAGNOSIS — G5701 Lesion of sciatic nerve, right lower limb: Secondary | ICD-10-CM | POA: Diagnosis not present

## 2023-12-17 DIAGNOSIS — M069 Rheumatoid arthritis, unspecified: Secondary | ICD-10-CM | POA: Diagnosis not present

## 2023-12-17 DIAGNOSIS — M19011 Primary osteoarthritis, right shoulder: Secondary | ICD-10-CM | POA: Diagnosis not present

## 2024-01-02 DIAGNOSIS — M19011 Primary osteoarthritis, right shoulder: Secondary | ICD-10-CM | POA: Diagnosis not present

## 2024-01-02 DIAGNOSIS — M7541 Impingement syndrome of right shoulder: Secondary | ICD-10-CM | POA: Diagnosis not present

## 2024-01-16 DIAGNOSIS — M545 Low back pain, unspecified: Secondary | ICD-10-CM | POA: Diagnosis not present

## 2024-01-16 DIAGNOSIS — M47816 Spondylosis without myelopathy or radiculopathy, lumbar region: Secondary | ICD-10-CM | POA: Diagnosis not present

## 2024-01-24 DIAGNOSIS — Z79899 Other long term (current) drug therapy: Secondary | ICD-10-CM | POA: Diagnosis not present

## 2024-01-24 DIAGNOSIS — M0579 Rheumatoid arthritis with rheumatoid factor of multiple sites without organ or systems involvement: Secondary | ICD-10-CM | POA: Diagnosis not present

## 2024-01-24 DIAGNOSIS — R5383 Other fatigue: Secondary | ICD-10-CM | POA: Diagnosis not present

## 2024-01-24 DIAGNOSIS — M1991 Primary osteoarthritis, unspecified site: Secondary | ICD-10-CM | POA: Diagnosis not present

## 2024-01-30 DIAGNOSIS — D3131 Benign neoplasm of right choroid: Secondary | ICD-10-CM | POA: Diagnosis not present

## 2024-03-16 DIAGNOSIS — H5711 Ocular pain, right eye: Secondary | ICD-10-CM | POA: Diagnosis not present

## 2024-03-16 DIAGNOSIS — S0501XA Injury of conjunctiva and corneal abrasion without foreign body, right eye, initial encounter: Secondary | ICD-10-CM | POA: Diagnosis not present

## 2024-03-20 DIAGNOSIS — H5711 Ocular pain, right eye: Secondary | ICD-10-CM | POA: Diagnosis not present

## 2024-03-20 DIAGNOSIS — S0501XA Injury of conjunctiva and corneal abrasion without foreign body, right eye, initial encounter: Secondary | ICD-10-CM | POA: Diagnosis not present

## 2024-03-23 DIAGNOSIS — S0501XA Injury of conjunctiva and corneal abrasion without foreign body, right eye, initial encounter: Secondary | ICD-10-CM | POA: Diagnosis not present

## 2024-03-23 DIAGNOSIS — H5711 Ocular pain, right eye: Secondary | ICD-10-CM | POA: Diagnosis not present

## 2024-03-24 DIAGNOSIS — H179 Unspecified corneal scar and opacity: Secondary | ICD-10-CM | POA: Diagnosis not present

## 2024-03-24 DIAGNOSIS — H16001 Unspecified corneal ulcer, right eye: Secondary | ICD-10-CM | POA: Diagnosis not present

## 2024-03-24 DIAGNOSIS — H11002 Unspecified pterygium of left eye: Secondary | ICD-10-CM | POA: Diagnosis not present

## 2024-03-24 DIAGNOSIS — H25813 Combined forms of age-related cataract, bilateral: Secondary | ICD-10-CM | POA: Diagnosis not present

## 2024-03-26 DIAGNOSIS — Z79899 Other long term (current) drug therapy: Secondary | ICD-10-CM | POA: Diagnosis not present

## 2024-03-26 DIAGNOSIS — H5789 Other specified disorders of eye and adnexa: Secondary | ICD-10-CM | POA: Diagnosis not present

## 2024-03-26 DIAGNOSIS — M0579 Rheumatoid arthritis with rheumatoid factor of multiple sites without organ or systems involvement: Secondary | ICD-10-CM | POA: Diagnosis not present

## 2024-03-26 DIAGNOSIS — M1991 Primary osteoarthritis, unspecified site: Secondary | ICD-10-CM | POA: Diagnosis not present

## 2024-05-05 DIAGNOSIS — H25813 Combined forms of age-related cataract, bilateral: Secondary | ICD-10-CM | POA: Diagnosis not present

## 2024-05-05 DIAGNOSIS — H11002 Unspecified pterygium of left eye: Secondary | ICD-10-CM | POA: Diagnosis not present

## 2024-05-05 DIAGNOSIS — H16001 Unspecified corneal ulcer, right eye: Secondary | ICD-10-CM | POA: Diagnosis not present

## 2024-05-05 DIAGNOSIS — H179 Unspecified corneal scar and opacity: Secondary | ICD-10-CM | POA: Diagnosis not present
